# Patient Record
Sex: Male | Born: 2005 | Race: White | Hispanic: No | Marital: Single | Smoking: Never smoker
Health system: Southern US, Community
[De-identification: ages and names within clinical notes are randomized; demographics above are authoritative.]

## PROBLEM LIST (undated history)

## (undated) DIAGNOSIS — R51 Headache: Secondary | ICD-10-CM

## (undated) DIAGNOSIS — R519 Headache, unspecified: Secondary | ICD-10-CM

## (undated) DIAGNOSIS — Z8489 Family history of other specified conditions: Secondary | ICD-10-CM

## (undated) HISTORY — PX: OTHER SURGICAL HISTORY: SHX169

---
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
## 2023-02-08 DIAGNOSIS — S0501XA Injury of conjunctiva and corneal abrasion without foreign body, right eye, initial encounter: Secondary | ICD-10-CM | POA: Diagnosis not present
